# Patient Record
Sex: Male | Born: 1965 | Race: White | Hispanic: No | Smoking: Never smoker
Health system: Southern US, Community
[De-identification: ages and names within clinical notes are randomized; demographics above are authoritative.]

## PROBLEM LIST (undated history)

## (undated) DIAGNOSIS — F419 Anxiety disorder, unspecified: Secondary | ICD-10-CM

---

## 2011-06-18 ENCOUNTER — Encounter: Payer: Self-pay | Admitting: Emergency Medicine

## 2011-06-18 ENCOUNTER — Emergency Department (HOSPITAL_BASED_OUTPATIENT_CLINIC_OR_DEPARTMENT_OTHER)
Admission: EM | Admit: 2011-06-18 | Discharge: 2011-06-18 | Disposition: A | Discharge status: Home / Self Care | Payer: BC Managed Care – PPO | Attending: Emergency Medicine | Admitting: Emergency Medicine

## 2011-06-18 ENCOUNTER — Emergency Department (INDEPENDENT_AMBULATORY_CARE_PROVIDER_SITE_OTHER): Payer: BC Managed Care – PPO

## 2011-06-18 DIAGNOSIS — M25569 Pain in unspecified knee: Secondary | ICD-10-CM

## 2011-06-18 DIAGNOSIS — M25469 Effusion, unspecified knee: Secondary | ICD-10-CM

## 2011-06-18 DIAGNOSIS — T148XXA Other injury of unspecified body region, initial encounter: Secondary | ICD-10-CM | POA: Insufficient documentation

## 2011-06-18 DIAGNOSIS — X500XXA Overexertion from strenuous movement or load, initial encounter: Secondary | ICD-10-CM

## 2011-06-18 DIAGNOSIS — R609 Edema, unspecified: Secondary | ICD-10-CM

## 2011-06-18 DIAGNOSIS — X58XXXA Exposure to other specified factors, initial encounter: Secondary | ICD-10-CM | POA: Insufficient documentation

## 2011-06-18 HISTORY — DX: Anxiety disorder, unspecified: F41.9

## 2011-06-18 MED ORDER — TETANUS-DIPHTH-ACELL PERTUSSIS 5-2-15.5 LF-MCG/0.5 IM SUSP
INTRAMUSCULAR | Status: AC
  Administered 2011-06-18: 0.5 mL via INTRAMUSCULAR
  Filled 2011-06-18: qty 0.5

## 2011-06-18 MED ORDER — TETANUS-DIPHTH-ACELL PERTUSSIS 5-2-15.5 LF-MCG/0.5 IM SUSP
0.5000 mL | Freq: Once | INTRAMUSCULAR | Status: AC
  Administered 2011-06-18: 0.5 mL via INTRAMUSCULAR

## 2011-06-18 NOTE — ED Provider Notes (Addendum)
History     Chief Complaint  Patient presents with  . Knee Injury   Patient is a 45 y.o. male presenting with knee pain. The history is provided by the patient.  Knee Pain This is a new problem. Episode onset: 6 days ago. The problem occurs constantly. The problem has been gradually improving. Pertinent negatives include no chest pain and no shortness of breath. The symptoms are aggravated by walking. The symptoms are relieved by nothing.    Past Medical History  Diagnosis Date  . Anxiety     History reviewed. No pertinent past surgical history.  No family history on file.  History  Substance Use Topics  . Smoking status: Never Smoker   . Smokeless tobacco: Not on file  . Alcohol Use: Yes     occ      Review of Systems  Constitutional: Negative for fever.  Respiratory: Negative for shortness of breath.   Cardiovascular: Negative for chest pain.  Skin: Negative for rash.  Neurological: Negative for weakness and numbness.    Physical Exam  BP 141/90  Pulse 92  Temp(Src) 99.3 F (37.4 C) (Oral)  Resp 16  SpO2 98%  Physical Exam  Constitutional: He is oriented to person, place, and time. He appears well-developed and well-nourished. No distress.  HENT:  Head: Atraumatic.  Eyes: Pupils are equal, round, and reactive to light.  Neck: Neck supple. No tracheal deviation present.  Cardiovascular: Normal rate.   Pulmonary/Chest: Effort normal. No accessory muscle usage. No respiratory distress.  Abdominal: He exhibits no distension.  Musculoskeletal: Normal range of motion.       Swelling left knee and lower leg. Effusion. Tenderness anteriorly. Superficial abrasion ant knee. No cellulitis. Distal pulses palp. Knee grossly stable w good rom. Good rom at hip and ankle without pain.   Neurological: He is alert and oriented to person, place, and time.  Skin: Skin is warm and dry.  Psychiatric: He has a normal mood and affect.    ED Course  Procedures  MDM Xray  reviewed. Knee immobilizer. Discussed w pt, incl dd soft  Tissue, ligament/acl injury, etc. Pt has orthopedist, [facility], will follow up there. Knee imm.  Close outpt follow up/possible outpt mri. Pt and fam state swelling/pain much improved from prior. No numbness/weakness. Ambulatory.   Pt cannot recall last tetanus. Tetanus im.     Suzi Roots, MD 06/18/11 1824  Suzi Roots, MD 06/18/11 [id number]  Suzi Roots, MD 06/18/11 Paulo Fruit

## 2021-03-15 ENCOUNTER — Ambulatory Visit (INDEPENDENT_AMBULATORY_CARE_PROVIDER_SITE_OTHER): Payer: BC Managed Care – PPO | Admitting: Podiatry

## 2021-03-15 ENCOUNTER — Ambulatory Visit (INDEPENDENT_AMBULATORY_CARE_PROVIDER_SITE_OTHER): Payer: BC Managed Care – PPO

## 2021-03-15 ENCOUNTER — Other Ambulatory Visit: Payer: Self-pay

## 2021-03-15 ENCOUNTER — Other Ambulatory Visit: Payer: Self-pay | Admitting: Podiatry

## 2021-03-15 ENCOUNTER — Encounter: Payer: Self-pay | Admitting: Podiatry

## 2021-03-15 DIAGNOSIS — M79672 Pain in left foot: Secondary | ICD-10-CM

## 2021-03-15 DIAGNOSIS — M7672 Peroneal tendinitis, left leg: Secondary | ICD-10-CM | POA: Diagnosis not present

## 2021-03-15 DIAGNOSIS — M24472 Recurrent dislocation, left ankle: Secondary | ICD-10-CM | POA: Diagnosis not present

## 2021-03-15 NOTE — Progress Notes (Signed)
  Subjective:  Patient ID: Derek Munoz, male    DOB: [birth year],  MRN: [id number]  Chief Complaint  Patient presents with  . Foot Pain    Left foot/ankle pain for about a year. PT stated that the pain comes and go     55 y.o. male presents with the above complaint. History confirmed with patient.  Does not recall any particular injury, this started after he was diagnosed with Covid and developed Guillain-Barr syndrome.  Feels like it is worse in certain positions with his foot.  Objective:  Physical Exam: warm, good capillary refill, no trophic changes or ulcerative lesions, normal DP and PT pulses and normal sensory exam. Left Foot: Pain in the peroneal tendons in the retromalleolar groove, this is worse with circumduction of the foot on the ankle and elicits a clicking dislocation .  Radiographs: X-ray of the left foot: no fracture, dislocation, swelling or degenerative changes noted Assessment:   1. Left foot pain   2. 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 Peroneal tendinitis of left lower extremity      Plan:  Patient was evaluated and treated and all questions answered.  Reviewed clinical and radiographic findings with patient detail discussed with him that he likely has peroneal tendon instability and dislocation of the peroneals from the tendon group.  Unclear how this relates to his previous Covid and Guillain-Barr syndrome diagnoses.  Would like to evaluate with an MRI of the left ankle in the event that he require surgical intervention.  In the interim recommend home physical therapy, OTC NSAIDs for pain control, support with a Tri-Lock ankle brace which dispensed today.  Return after MRI for review  Return in about 4 weeks (around 04/12/2021) for after MRI to review.

## 2021-03-15 NOTE — Patient Instructions (Signed)
Peroneal Tendinopathy Rehab Ask your health care provider which exercises are safe for you. Do exercises exactly as told by your health care provider and adjust them as directed. It is normal to feel mild stretching, pulling, tightness, or discomfort as you do these exercises. Stop right away if you feel sudden pain or your pain gets worse. Do not begin these exercises until told by your health care provider. Stretching and range-of-motion exercises These exercises warm up your muscles and joints and improve the movement and flexibility of your ankle. These exercises also help to relieve pain and stiffness. Gastroc and soleus stretch, standing  This is an exercise in which you stand on a step and use your body weight to stretch your calf muscles. To do this exercise: 1. Stand on the edge of a step on the ball of your left / right foot. The ball of your foot is on the walking surface, right under your toes. 2. Keep your other foot firmly on the same step. 3. Hold on to the wall, a railing, or a chair for balance. 4. Slowly lift your other foot, allowing your body weight to press your left / right heel down over the edge of the step. You should feel a stretch in your left / right calf (gastrocnemius and soleus). 5. Hold this position for 15 seconds. 6. Return both feet to the step. 7. Repeat this exercise with a slight bend in your left / right knee. Repeat 5 times with your left / right knee straight and 5 times with your left / right knee bent. Complete this exercise 2 times a day. Strengthening exercises These exercises build strength and endurance in your foot and ankle. Endurance is the ability to use your muscles for a long time, even after they get tired. Ankle dorsiflexion with band   1. Secure a rubber exercise band or tube to an object, such as a table leg, that will not move when the band is pulled. 2. Secure the other end of the band around your left / right foot. 3. Sit on the floor,  facing the object with your left / right leg extended. The band or tube should be slightly tense when your foot is relaxed. 4. Slowly flex your left / right ankle and toes to bring your foot toward you (dorsiflexion). 5. Hold this position for 15 seconds. 6. Let the band or tube slowly pull your foot back to the starting position. Repeat 5 times. Complete this exercise 2 times a day. Ankle eversion 1. Sit on the floor with your legs straight out in front of you. 2. Loop a rubber exercise band or tube around the ball of your left / right foot. The ball of your foot is on the walking surface, right under your toes. 3. Hold the ends of the band in your hands, or secure the band to a stable object. The band or tube should be slightly tense when your foot is relaxed. 4. Slowly push your foot outward, away from your other leg (eversion). 5. Hold this position for 15 seconds. 6. Slowly return your foot to the starting position. Repeat 5 times. Complete this exercise 2 times a day. Plantar flexion, standing  This exercise is sometimes called standing heel raise. 1. Stand with your feet shoulder-width apart. 2. Place your hands on a wall or table to steady yourself as needed, but try not to use it for support. 3. Keep your weight spread evenly over the width of your feet   while you slowly rise up on your toes (plantar flexion). If told by your health care provider: ? Shift your weight toward your left / right leg until you feel challenged. ? Stand on your left / right leg only. 4. Hold this position for 15 seconds. Repeat 2 times. Complete this exercise 2 times a day. Single leg stand 1. Without shoes, stand near a railing or in a doorway. You may hold on to the railing or door frame as needed. 2. Stand on your left / right foot. Keep your big toe down on the floor and try to keep your arch lifted. ? Do not roll to the outside of your foot. ? If this exercise is too easy, you can try it with your  eyes closed or while standing on a pillow. 3. Hold this position for 15 seconds. Repeat 5 times. Complete this exercise 2 times a day. This information is not intended to replace advice given to you by your health care provider. Make sure you discuss any questions you have with your health care provider. Document Revised: 03/18/2019 Document Reviewed: 03/18/2019 Elsevier Patient Education  2020 [employer]  

## 2021-03-25 ENCOUNTER — Ambulatory Visit
Admission: RE | Admit: 2021-03-25 | Discharge: 2021-03-25 | Disposition: A | Discharge status: Home / Self Care | Payer: BC Managed Care – PPO | Source: Ambulatory Visit | Attending: Podiatry | Admitting: Podiatry

## 2021-03-25 DIAGNOSIS — M7672 Peroneal tendinitis, left leg: Secondary | ICD-10-CM

## 2021-03-25 DIAGNOSIS — M24472 Recurrent dislocation, left ankle: Secondary | ICD-10-CM

## 2021-03-25 DIAGNOSIS — M79672 Pain in left foot: Secondary | ICD-10-CM

## 2021-03-29 ENCOUNTER — Telehealth: Payer: Self-pay | Admitting: *Deleted

## 2021-03-29 NOTE — Telephone Encounter (Addendum)
Patient is caling for the status of his MRI results that was completed on Friday 03/25/21. Please advise  Returned call to patient and informed per Dr Lilian Kapur of MRI results, patient verbalized understanding.

## 2021-03-29 NOTE — Telephone Encounter (Signed)
Primarily inflammation along the tendons, no tears. We will discuss at his visit in 2 weeks

## 2021-04-12 ENCOUNTER — Other Ambulatory Visit: Payer: Self-pay

## 2021-04-12 ENCOUNTER — Encounter: Payer: Self-pay | Admitting: Podiatry

## 2021-04-12 ENCOUNTER — Ambulatory Visit (INDEPENDENT_AMBULATORY_CARE_PROVIDER_SITE_OTHER): Payer: BC Managed Care – PPO | Admitting: Podiatry

## 2021-04-12 DIAGNOSIS — M25572 Pain in left ankle and joints of left foot: Secondary | ICD-10-CM | POA: Diagnosis not present

## 2021-04-12 DIAGNOSIS — G8929 Other chronic pain: Secondary | ICD-10-CM

## 2021-04-12 DIAGNOSIS — Z8669 Personal history of other diseases of the nervous system and sense organs: Secondary | ICD-10-CM

## 2021-04-12 DIAGNOSIS — M1990 Unspecified osteoarthritis, unspecified site: Secondary | ICD-10-CM | POA: Diagnosis not present

## 2021-04-12 MED ORDER — METHYLPREDNISOLONE 4 MG PO TBPK: ORAL_TABLET | ORAL | 0 refills | Status: AC

## 2021-04-12 NOTE — Progress Notes (Signed)
  Subjective:  Patient ID: Derek Munoz, male    DOB: September 23, 1966,  MRN: [id number]  Chief Complaint  Patient presents with  . Tendonitis       MRI to review ankle and foot pain    55 y.o. male    Returns  with the above complaint. History confirmed with patient.  Pain feels the same is radiating up the front of the ankle joint  Objective:  Physical Exam: warm, good capillary refill, no trophic changes or ulcerative lesions, normal DP and PT pulses and normal sensory exam. Left Foot: Today no pain in the peroneal tendons or retromalleolar groove, unable to reproduce much of his pain  Radiographs: X-ray of the left foot: no fracture, dislocation, swelling or degenerative changes noted   Study Result  Narrative & Impression  CLINICAL DATA:  Ankle pain, query peroneal tendonitis/subluxation.  EXAM: MRI OF THE LEFT ANKLE WITHOUT CONTRAST  TECHNIQUE: Multiplanar, multisequence MR imaging of the ankle was performed. No intravenous contrast was administered.  COMPARISON:  None.  FINDINGS: TENDONS  Peroneal: Mild common peroneus tendon sheath tenosynovitis with some overlying subcutaneous edema, but no obvious displacement or visible defect in the overlying retinaculum.  Posteromedial: Distal tibialis posterior tenosynovitis and tendinopathy, correlate clinically in assessing for tibialis posterior dysfunction.  Anterior: Mild extensor digitorum longus tenosynovitis.  Achilles: Unremarkable  Plantar Fascia: Abnormal thickening of the medial band with accentuated signal compatible with plantar fasciitis.  LIGAMENTS  Lateral: Notable edema/synovitis along the otherwise intact ATFL. Calcaneofibular ligament appears intact. The inferior tibiofibular ligaments appear intact.  Medial: Unremarkable  CARTILAGE  Ankle Joint: Unremarkable  Subtalar Joints/Sinus Tarsi: Mild spurring along the posterior subtalar facet and a small amount of edema in the  sinus tarsi. Posterior fusion of the posterior subtalar facet.  Bones: Unremarkable  Other: No supplemental non-categorized findings.  IMPRESSION: 1. There is mild common peroneus tendon sheath tenosynovitis with some overlying subcutaneous edema, but no overt tendinopathy or visible defect in the retinaculum. 2. Distal tibialis posterior tenosynovitis and tendinopathy, correlate clinically in assessing for tibialis posterior dysfunction. 3. The edema/synovitis along the otherwise intact ATFL. Synovitis in this region can be associated with anterolateral impingement but usually anterolateral impingement implies a torn ATFL. 4. Mild spurring along the posterior subtalar facet with a posterior subtalar facet joint effusion. 5. Mild extensor digitorum longus tenosynovitis. 6. Plantar fasciitis.   Electronically Signed   By: Gaylyn Rong M.D.   On: 03/27/2021 11:53     Assessment:   1. Inflammatory arthropathy   2. Chronic pain of left ankle   3. History of Guillain-Barre syndrome      Plan:  Patient was evaluated and treated and all questions answered.  I reviewed his MRI in detail with him.  I discussed with him that he has significant amount of tendon and joint inflammation without evidence of tendon tearing or avulsion, he does have synovitis.  Still unclear of the etiology is and how it relates to the Guillain-Barr syndrome he had.  I think evaluate for inflammatory arthropathy and activity and I have ordered numerous labs to evaluate this.  Return for lab work to evaluate.  Consider to referral to neurology and/or rheumatology  No follow-ups on file.

## 2021-04-20 LAB — HLA-B27 ANTIGEN: HLA-B27 Antigen: POSITIVE — AB

## 2021-04-20 LAB — B. BURGDORFI ANTIBODIES: B burgdorferi Ab IgG+IgM: <0.9 index

## 2021-05-16 ENCOUNTER — Ambulatory Visit (INDEPENDENT_AMBULATORY_CARE_PROVIDER_SITE_OTHER): Payer: BC Managed Care – PPO | Admitting: Podiatry

## 2021-05-16 ENCOUNTER — Other Ambulatory Visit: Payer: Self-pay

## 2021-05-16 ENCOUNTER — Encounter: Payer: Self-pay | Admitting: Podiatry

## 2021-05-16 DIAGNOSIS — M1990 Unspecified osteoarthritis, unspecified site: Secondary | ICD-10-CM | POA: Diagnosis not present

## 2021-05-16 DIAGNOSIS — M25572 Pain in left ankle and joints of left foot: Secondary | ICD-10-CM

## 2021-05-16 DIAGNOSIS — G8929 Other chronic pain: Secondary | ICD-10-CM | POA: Diagnosis not present

## 2021-05-16 DIAGNOSIS — Z8669 Personal history of other diseases of the nervous system and sense organs: Secondary | ICD-10-CM | POA: Diagnosis not present

## 2021-05-16 MED ORDER — CELECOXIB 100 MG PO CAPS: 100.0000 mg | ORAL_CAPSULE | Freq: Two times a day (BID) | ORAL | 0 refills | Status: AC

## 2021-05-16 NOTE — Patient Instructions (Signed)
[facility] [address] [place], [place] [address] [phone]

## 2021-05-18 NOTE — Progress Notes (Signed)
  Subjective:  Patient ID: Derek Munoz, male    DOB: 05-03-1966,  MRN: [id number]  Chief Complaint  Patient presents with  . Foot Pain    6 week follow up, left foot/ankle pain    55 y.o. male    Returns  with the above complaint. History confirmed with patient.  Slight improvement but continues to have migratory pain of both feet  Objective:  Physical Exam: warm, good capillary refill, no trophic changes or ulcerative lesions, normal DP and PT pulses and normal sensory exam. Left Foot: Today no pain in the peroneal tendons or retromalleolar groove, unable to reproduce much of his pain  Radiographs: X-ray of the left foot: no fracture, dislocation, swelling or degenerative changes noted   Study Result  Narrative & Impression  CLINICAL DATA:  Ankle pain, query peroneal tendonitis/subluxation.  EXAM: MRI OF THE LEFT ANKLE WITHOUT CONTRAST  TECHNIQUE: Multiplanar, multisequence MR imaging of the ankle was performed. No intravenous contrast was administered.  COMPARISON:  None.  FINDINGS: TENDONS  Peroneal: Mild common peroneus tendon sheath tenosynovitis with some overlying subcutaneous edema, but no obvious displacement or visible defect in the overlying retinaculum.  Posteromedial: Distal tibialis posterior tenosynovitis and tendinopathy, correlate clinically in assessing for tibialis posterior dysfunction.  Anterior: Mild extensor digitorum longus tenosynovitis.  Achilles: Unremarkable  Plantar Fascia: Abnormal thickening of the medial band with accentuated signal compatible with plantar fasciitis.  LIGAMENTS  Lateral: Notable edema/synovitis along the otherwise intact ATFL. Calcaneofibular ligament appears intact. The inferior tibiofibular ligaments appear intact.  Medial: Unremarkable  CARTILAGE  Ankle Joint: Unremarkable  Subtalar Joints/Sinus Tarsi: Mild spurring along the posterior subtalar facet and a small amount of edema in  the sinus tarsi. Posterior fusion of the posterior subtalar facet.  Bones: Unremarkable  Other: No supplemental non-categorized findings.  IMPRESSION: 1. There is mild common peroneus tendon sheath tenosynovitis with some overlying subcutaneous edema, but no overt tendinopathy or visible defect in the retinaculum. 2. Distal tibialis posterior tenosynovitis and tendinopathy, correlate clinically in assessing for tibialis posterior dysfunction. 3. The edema/synovitis along the otherwise intact ATFL. Synovitis in this region can be associated with anterolateral impingement but usually anterolateral impingement implies a torn ATFL. 4. Mild spurring along the posterior subtalar facet with a posterior subtalar facet joint effusion. 5. Mild extensor digitorum longus tenosynovitis. 6. Plantar fasciitis.   Electronically Signed   By: Van Clines M.D.   On: 03/27/2021 11:53     Assessment:   1. Inflammatory arthropathy   2. Chronic pain of left ankle   3. History of Guillain-Barre syndrome      Plan:  Patient was evaluated and treated and all questions answered.  Reviewed his lab work with him we discussed that his HLA-B27 is positive.  Unclear the significance of this.  Lyme work-up was negative as was gout.  I would like to refer him to rheumatology to see if they are able to elucidate this any further.  No follow-ups on file.

## 2021-08-16 IMAGING — MR MR ANKLE*L* W/O CM
5 series · 40 of 40 positions shown · non-contrast
Comparison: None.

CLINICAL DATA: Ankle pain, query peroneal tendonitis/subluxation.

EXAM:
MRI OF THE LEFT ANKLE WITHOUT CONTRAST
TECHNIQUE: Multiplanar, multisequence MR imaging of the ankle was performed. No
intravenous contrast was administered.

[Series 4: T2 fat-sat · axial · 3.0mm · 0.56mm/px · z∈[-110,+29]mm · 8 of 36 slices shown (1 of 2)]
[im 1/36]
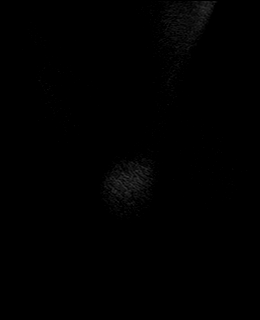
[im 6/36]
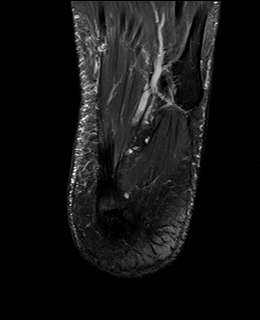
[im 11/36]
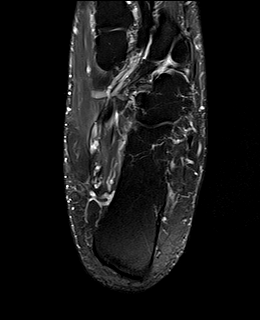
[im 16/36]
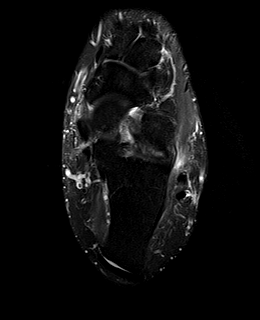
[im 21/36]
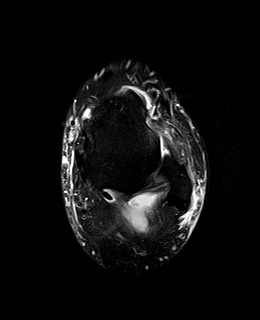
[im 26/36]
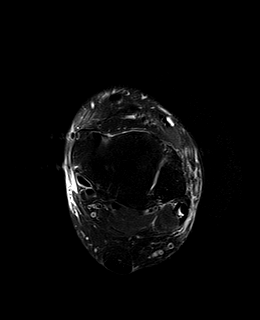
[im 31/36]
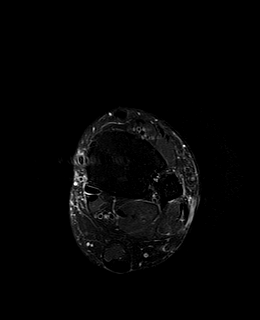
[im 36/36]
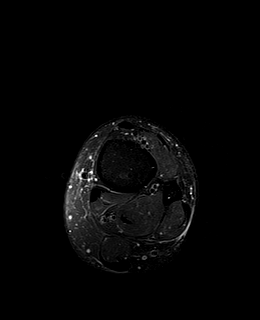

[Series 5: PD fat-sat · axial · 3.0mm · 0.56mm/px · z∈[-110,+29]mm · 9 of 36 slices shown]
[im 1/36]
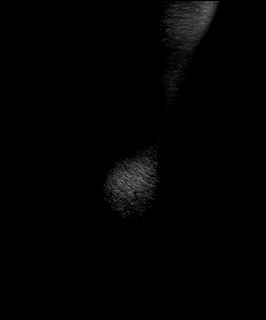
[im 5/36]
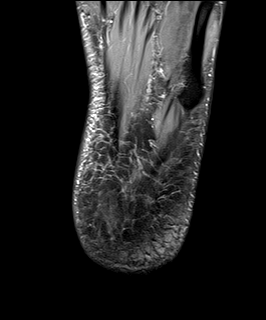
[im 9/36]
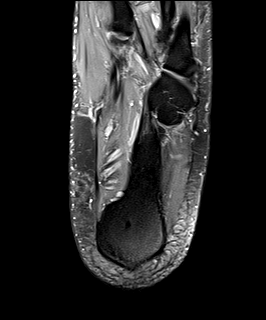
[im 14/36]
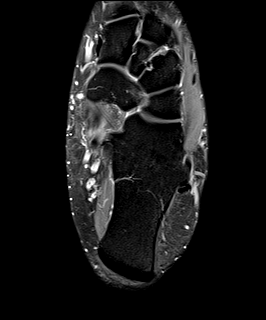
[im 18/36]
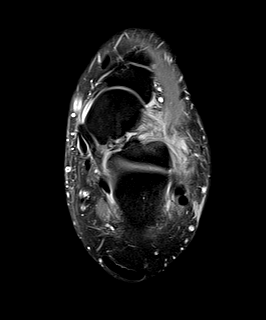
[im 22/36]
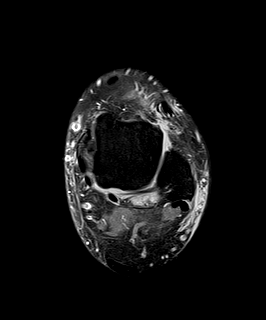
[im 27/36]
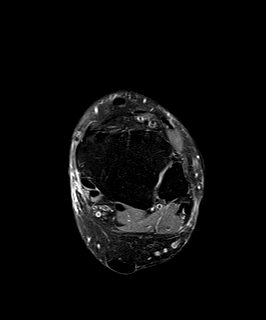
[im 31/36]
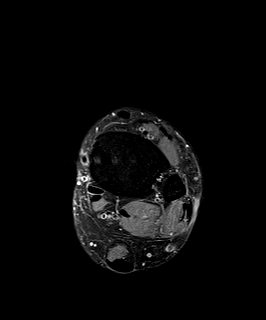
[im 36/36]
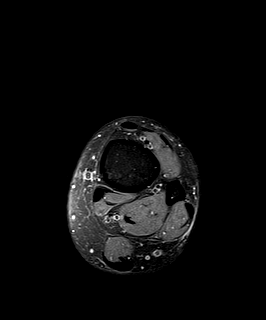

[Series 6: T2 fat-sat · coronal · 3.0mm · 0.66mm/px · 11 of 43 slices shown (2 of 2)]
[im 1/43]
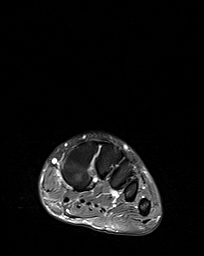
[im 5/43]
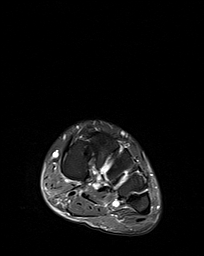
[im 9/43]
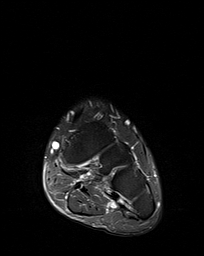
[im 13/43]
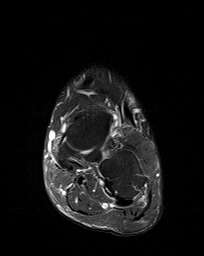
[im 17/43]
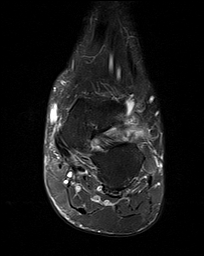
[im 22/43]
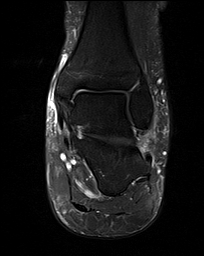
[im 26/43]
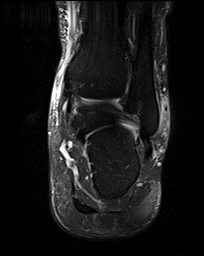
[im 30/43]
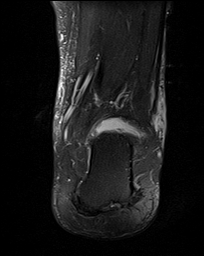
[im 34/43]
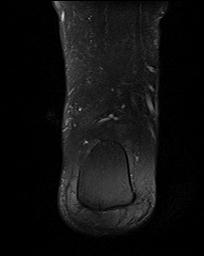
[im 38/43]
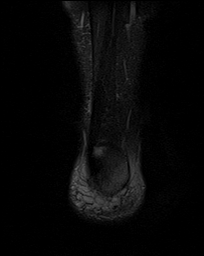
[im 43/43]
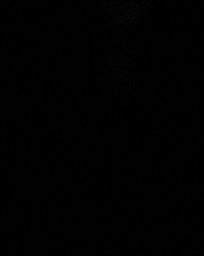

[Series 7: T1 · sagittal · 4.0mm · 0.56mm/px · 6 of 22 slices shown]
[im 1/22]
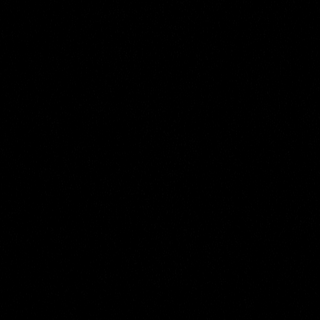
[im 5/22]
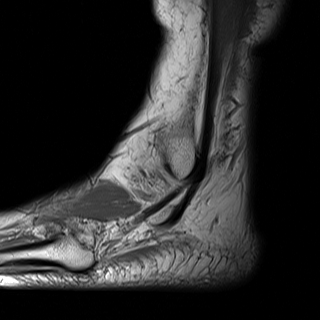
[im 9/22]
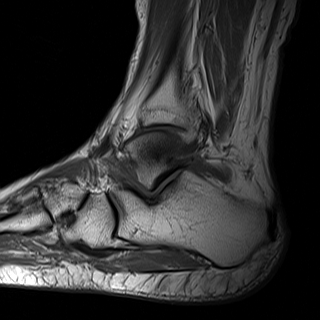
[im 13/22]
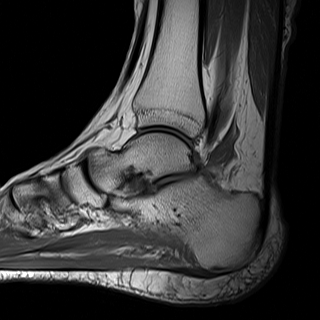
[im 17/22]
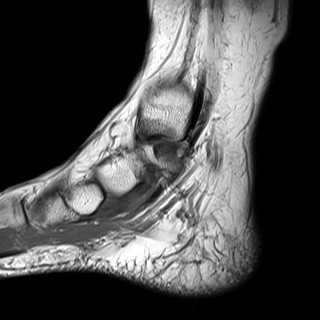
[im 22/22]
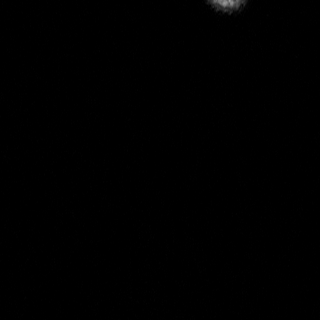

[Series 8: STIR · sagittal · 4.0mm · 0.70mm/px · 6 of 22 slices shown]
[im 1/22]
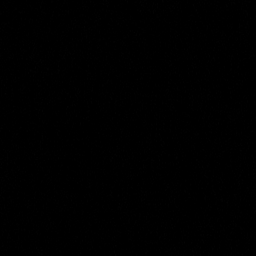
[im 5/22]
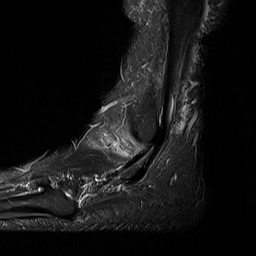
[im 9/22]
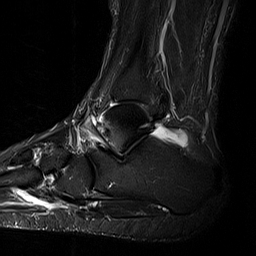
[im 13/22]
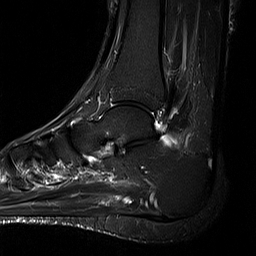
[im 17/22]
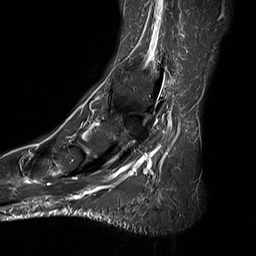
[im 22/22]
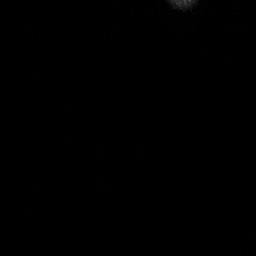

[40 of 40 positions shown; findings below may reference images not displayed]

FINDINGS: TENDONS

Peroneal: Mild common peroneus tendon sheath tenosynovitis with some
overlying subcutaneous edema, but no obvious displacement or visible
defect in the overlying retinaculum.

Posteromedial: Distal tibialis posterior tenosynovitis and
tendinopathy, correlate clinically in assessing for tibialis
posterior dysfunction.

Anterior: Mild extensor digitorum longus tenosynovitis.

Achilles: Unremarkable

Plantar Fascia: Abnormal thickening of the medial band with
accentuated signal compatible with plantar fasciitis.

LIGAMENTS

Lateral: Notable edema/synovitis along the otherwise intact ATFL.
Calcaneofibular ligament appears intact. The inferior tibiofibular
ligaments appear intact.

Medial: Unremarkable

CARTILAGE

Ankle Joint: Unremarkable

Subtalar Joints/Sinus Tarsi: Mild spurring along the posterior
subtalar facet and a small amount of edema in the sinus tarsi.
Posterior fusion of the posterior subtalar facet.

Bones: Unremarkable

Other: No supplemental non-categorized findings.
IMPRESSION: 1. There is mild common peroneus tendon sheath tenosynovitis with
some overlying subcutaneous edema, but no overt tendinopathy or
visible defect in the retinaculum.
2. Distal tibialis posterior tenosynovitis and tendinopathy,
correlate clinically in assessing for tibialis posterior
dysfunction.
3. The edema/synovitis along the otherwise intact ATFL. Synovitis in
this region can be associated with anterolateral impingement but
usually anterolateral impingement implies a torn ATFL.
4. Mild spurring along the posterior subtalar facet with a posterior
subtalar facet joint effusion.
5. Mild extensor digitorum longus tenosynovitis.
6. Plantar fasciitis.
